# Patient Record
Sex: Female | Born: 1955 | Race: White | Hispanic: No | Marital: Married | Smoking: Never smoker
Health system: Southern US, Community
[De-identification: ages and names within clinical notes are randomized; demographics above are authoritative.]

---

## 1997-10-21 ENCOUNTER — Other Ambulatory Visit: Admission: RE | Admit: 1997-10-21 | Discharge: 1997-10-21 | Payer: Self-pay | Admitting: Obstetrics and Gynecology

## 1997-12-31 ENCOUNTER — Encounter: Payer: Self-pay | Admitting: Emergency Medicine

## 1997-12-31 ENCOUNTER — Emergency Department (HOSPITAL_COMMUNITY): Admission: EM | Admit: 1997-12-31 | Discharge: 1997-12-31 | Payer: Self-pay | Admitting: Emergency Medicine

## 1998-04-22 ENCOUNTER — Other Ambulatory Visit: Admission: RE | Admit: 1998-04-22 | Discharge: 1998-04-22 | Payer: Self-pay | Admitting: Obstetrics and Gynecology

## 1998-10-29 ENCOUNTER — Other Ambulatory Visit: Admission: RE | Admit: 1998-10-29 | Discharge: 1998-10-29 | Payer: Self-pay | Admitting: Obstetrics and Gynecology

## 2000-04-12 ENCOUNTER — Other Ambulatory Visit: Admission: RE | Admit: 2000-04-12 | Discharge: 2000-04-12 | Payer: Self-pay | Admitting: Obstetrics and Gynecology

## 2001-11-01 ENCOUNTER — Other Ambulatory Visit: Admission: RE | Admit: 2001-11-01 | Discharge: 2001-11-01 | Payer: Self-pay | Admitting: Obstetrics and Gynecology

## 2003-05-01 ENCOUNTER — Other Ambulatory Visit: Admission: RE | Admit: 2003-05-01 | Discharge: 2003-05-01 | Payer: Self-pay | Admitting: Obstetrics and Gynecology

## 2006-01-25 ENCOUNTER — Other Ambulatory Visit: Admission: RE | Admit: 2006-01-25 | Discharge: 2006-01-25 | Payer: Self-pay | Admitting: Obstetrics & Gynecology

## 2007-10-26 ENCOUNTER — Other Ambulatory Visit: Admission: RE | Admit: 2007-10-26 | Discharge: 2007-10-26 | Payer: Self-pay | Admitting: Obstetrics and Gynecology

## 2007-11-29 ENCOUNTER — Ambulatory Visit: Payer: Self-pay | Admitting: Internal Medicine

## 2008-07-10 ENCOUNTER — Ambulatory Visit: Payer: Self-pay | Admitting: Internal Medicine

## 2008-08-07 ENCOUNTER — Encounter: Admission: RE | Admit: 2008-08-07 | Discharge: 2008-08-07 | Payer: Self-pay | Admitting: Otolaryngology

## 2008-08-18 ENCOUNTER — Ambulatory Visit: Payer: Self-pay | Admitting: Internal Medicine

## 2008-10-06 ENCOUNTER — Ambulatory Visit: Payer: Self-pay | Admitting: Internal Medicine

## 2010-12-19 IMAGING — CR DG ORBITS FOR FOREIGN BODY
2 series · 2 of 2 positions shown · non-contrast
Comparison: None

CLINICAL DATA: History of metal exposure, pre MRI

ORBITS FOR FOREIGN BODY - 2 VIEW

[view not recorded (1 of 2)]
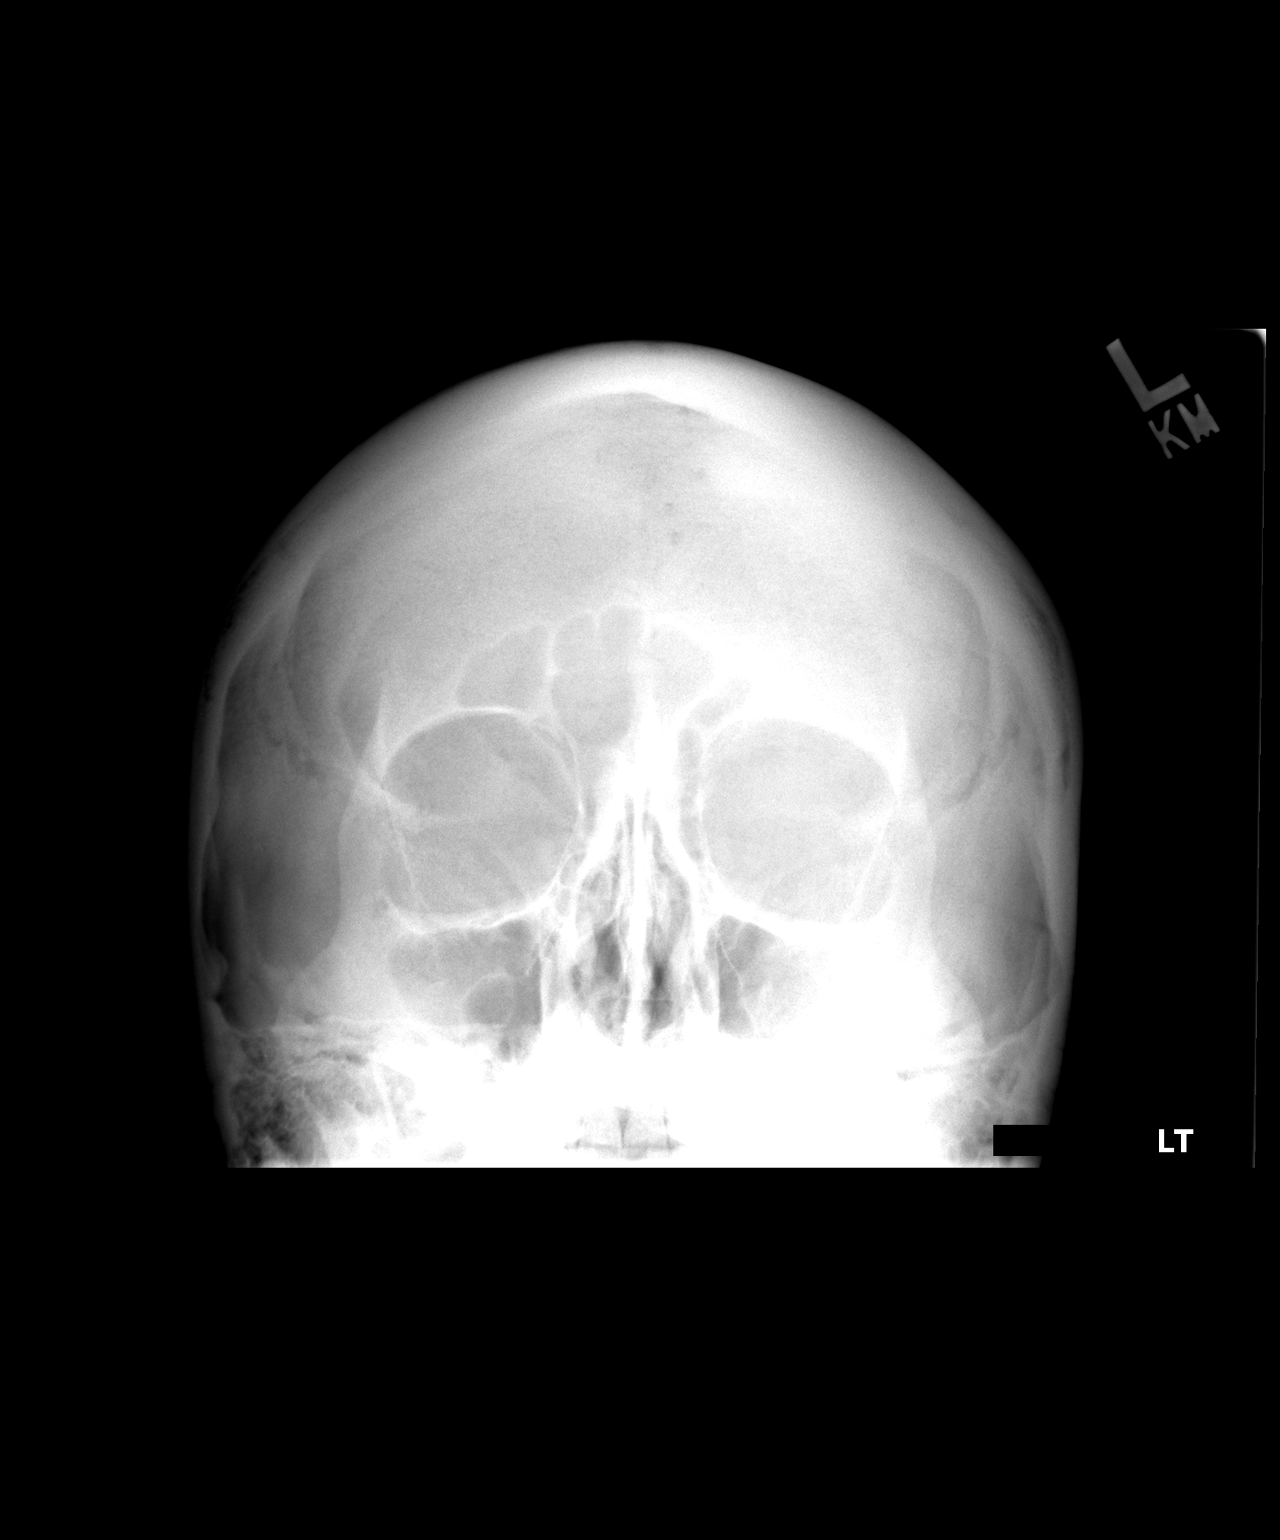

[view not recorded (2 of 2)]
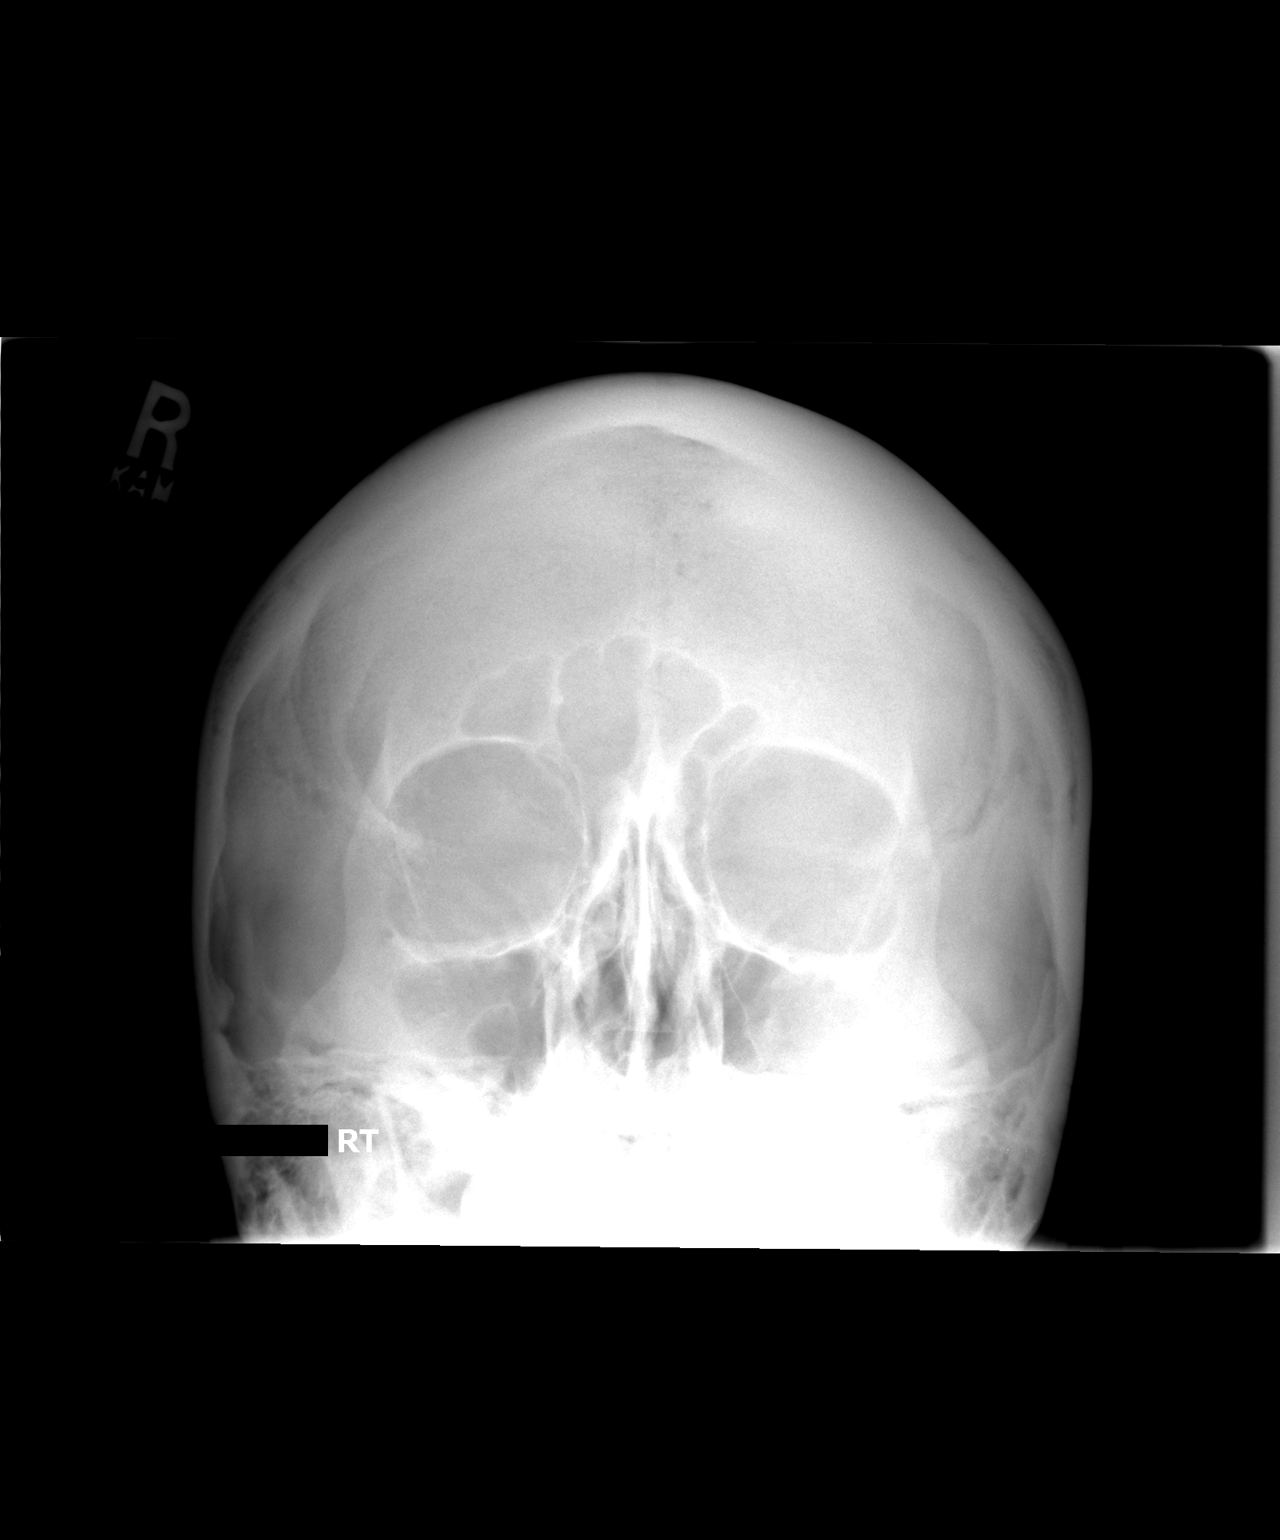

[2 of 2 positions shown; findings below may reference images not displayed]

FINDINGS: Views of the orbits were obtained with the patient
looking to the left and looking to the right.  No orbital metallic
foreign body is seen.  The paranasal sinuses are clear.  No bony
abnormality is noted.
IMPRESSION: No orbital metallic foreign body.

## 2015-07-30 ENCOUNTER — Encounter: Payer: Self-pay | Admitting: Sports Medicine

## 2015-07-30 ENCOUNTER — Ambulatory Visit (INDEPENDENT_AMBULATORY_CARE_PROVIDER_SITE_OTHER): Payer: BLUE CROSS/BLUE SHIELD | Admitting: Sports Medicine

## 2015-07-30 VITALS — BP 118/82 | HR 77 | Ht 67.0 in | Wt 158.0 lb

## 2015-07-30 DIAGNOSIS — M722 Plantar fascial fibromatosis: Secondary | ICD-10-CM

## 2015-07-30 DIAGNOSIS — M79671 Pain in right foot: Secondary | ICD-10-CM | POA: Diagnosis not present

## 2015-07-30 NOTE — Progress Notes (Signed)
   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 Pain is intermittent and worsened with hiking, running, or wearing dress shoes. Pain is notable with heel impact on the ground. She did not have nighttime pain, but reports pain with the first step in the morning. She did not increase her activity prior to her current symptoms. She reports a history of plantar fasciitis. She has been trying orthotics and different shoes over the last few months without significant improvement until the last 2 weeks. She has adjusted her activity and included stretching exercises for treatment without medication use.   No past medical history on file.  Review of Systems:  Review of Systems  Musculoskeletal: Negative for falls.       Right medial heel pain. No knee or hip pain. Eversion of right foot with ambulation.   Neurological: Negative for tingling and sensory change.     Physical Exam:  Filed Vitals:   07/30/15 0934  BP: 118/82  Pulse: 77  Height: 5\' 7"  (1.702 m)  Weight: 158 lb (71.668 kg)   Physical Exam  Constitutional: She appears well-developed and well-nourished.  Musculoskeletal:  Strength and ROM of the right ankle intact with 5/5 dorsiflexion and plantarflexion. ROM of 1st toe intact without tenderness to palpation. Pain is elicited at the medial calcaneous with deep palpation and extension of great toe. Strength decreased with abduction of the right hip, intact on left hip. There is slight Valgus appearance at both knees. Right foot with external rotation on ambulation.    US  RT foot shows small heel spur/  PF was mildly increased in thickness at heel but shows larger increase along the long arch with level of 0.4 even 3 cms distal calcaneus ( nl 0.2)  Hypoechoic change at heel.  RT PF 0.51 vs Lt at 0.40 at heel   Assessment & Plan:   60 year old active female patient with at least 6 months of  right sided medial heel pain worsened with activity and first step in the morning. History and physical exam suggestive of chronic plantar fasciitis with resultant gluteus medius weakness. U/S performed by Dr. Darrick PennaFields consistent with chronic plantar fasciitis with increased thickness of the fascia at both feet. A calcaneal bone spur is seen on the right foot. Will continue home stretching exercises and add heel pads for her current inserts and follow up for orthotics. -Continue home stretching exercises, additional exercise information provided and demonstrated for patient -Heel pads added to inserts -F/u appointment for orthotics -Increase activity as tolerated, limit on busy work days, ice as needed

## 2015-07-30 NOTE — Assessment & Plan Note (Signed)
Because of the chronicity she would like to try a custom orthotic We will schedule her for this  Cont in the OTC inserts with heel pads added  Stretches Ice Calf raises

## 2015-08-20 ENCOUNTER — Encounter: Payer: Self-pay | Admitting: Sports Medicine

## 2015-08-20 ENCOUNTER — Ambulatory Visit (INDEPENDENT_AMBULATORY_CARE_PROVIDER_SITE_OTHER): Payer: BLUE CROSS/BLUE SHIELD | Admitting: Sports Medicine

## 2015-08-20 VITALS — BP 130/82 | HR 79 | Ht 67.0 in | Wt 158.0 lb

## 2015-08-20 DIAGNOSIS — M722 Plantar fascial fibromatosis: Secondary | ICD-10-CM | POA: Diagnosis not present

## 2015-08-20 NOTE — Progress Notes (Signed)
HPI/CC; chronic plantar fasciitis  Patient works as a International aid/development workerveterinarian. She is on her feet all throughout work. She has had over 6 months of right foot pain that did not resolve with conservative care. With chronic changes in her foot and her failure to respond to other treatments we brought her back today for custom orthotics.   Procedure note  Patient was fitted for a standard, cushioned, semi-rigid orthotic. The orthotic was heated and afterward the patient stood on the orthotic blank positioned on the orthotic stand. The patient was positioned in subtalar neutral position and 10 degrees of ankle dorsiflexion in a weight bearing stance. After completion of molding, a stable base was applied to the orthotic blank. The blank was ground to a stable position for weight bearing. Size: 9 [facility] Additional Posting and Padding: None The patient ambulated these, and they were very comfortable.  I spent 40 minutes with this patient, greater than 50% was face-to-face time counseling regarding the below diagnosis.

## 2015-08-20 NOTE — Assessment & Plan Note (Addendum)
Custom orthotic performed for patient.  Relief demonstrated before leaving office.  Continue conservative management and follow up as needed.  At completion of the orthotics we were able to say that her post orthotic gait Her in a subtalar neutral position.
